# Patient Record
Sex: Female | Born: 1995 | Race: White | Hispanic: No | Marital: Single
Health system: Midwestern US, Community
[De-identification: ages and names within clinical notes are randomized; demographics above are authoritative.]

---
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
## 2019-08-04 ENCOUNTER — Other Ambulatory Visit: Payer: Self-pay

## 2019-08-04 ENCOUNTER — Emergency Department
Admission: EM | Admit: 2019-08-04 | Discharge: 2019-08-04 | Disposition: A | Discharge status: Home / Self Care | Payer: Self-pay | Attending: Emergency Medicine | Admitting: Emergency Medicine

## 2019-08-04 DIAGNOSIS — K047 Periapical abscess without sinus: Secondary | ICD-10-CM | POA: Insufficient documentation

## 2019-08-04 MED ORDER — IBUPROFEN 600 MG PO TABS: 600.0000 mg | ORAL_TABLET | Freq: Three times a day (TID) | ORAL | 0 refills | Status: AC | PRN

## 2019-08-04 MED ORDER — CLINDAMYCIN HCL 150 MG PO CAPS: ORAL_CAPSULE | ORAL | 0 refills | Status: AC

## 2019-08-04 NOTE — ED Triage Notes (Addendum)
Pt comes via POV from home with c/o mouth pain. Pt states this has been going on for awhile. Pt states last night her filling came out of her top front tooth and she noticed drainage.  Pt states pain. No obvious drainage or swelling visible

## 2019-08-04 NOTE — Discharge Instructions (Addendum)
Begin taking antibiotics as directed until completely finished.  It is important that you follow-up with a dentist.  A list of dental clinics is listed on your discharge papers.  Also they most likely will refer you to an oral surgeon as there is very little tooth material above the gumline.

## 2019-08-04 NOTE — ED Provider Notes (Signed)
[facility] Emergency Department Provider Note  ____________________________________________   First MD Initiated Contact with Patient 08/04/19 1128     (approximate)  I have reviewed the triage vital signs and the nursing notes.   HISTORY  Chief Complaint Dental Pain   HPI Maria Holland is a 23 y.o. female presents to the ED with complaint of mouth pain.  Patient states that she is aware that she has multiple dental cavities but has not followed up with an oral surgeon.  Patient noticed drainage coming from her tooth.  She is unaware of any fever or chills.  She denies any nausea or vomiting.  Patient does continue to smoke.     History reviewed. No pertinent past medical history.  There are no active problems to display for this patient.     Prior to Admission medications   Medication Sig Start Date End Date Taking? Authorizing Provider  clindamycin (CLEOCIN) 150 MG capsule 2 caps tid until finished 08/04/19   Bridget Hartshorn L, PA-C  ibuprofen (ADVIL) 600 MG tablet Take 1 tablet (600 mg total) by mouth every 8 (eight) hours as needed. 08/04/19   Tommi Rumps, PA-C    Allergies Patient has no allergy information on record.  No family history on file.  Social History Social History   Tobacco Use  . Smoking status: Not on file  Substance Use Topics  . Alcohol use: Not on file  . Drug use: Not on file    Review of Systems Constitutional: No fever/chills ENT: Positive dental pain. Cardiovascular: Denies chest pain.  Respiratory: Denies shortness of breath. Gastrointestinal:  No nausea, no vomiting.  Musculoskeletal: Negative for muscle aches. Skin: Negative for rash. Neurological: Negative for headaches, focal weakness or numbness. ___________________________________________   PHYSICAL EXAM:  VITAL SIGNS: ED Triage Vitals  Enc Vitals Group     BP      Pulse      Resp      Temp      Temp src      SpO2      Weight       Height      Head Circumference      Peak Flow      Pain Score      Pain Loc      Pain Edu?      Excl. in GC?    Constitutional: Alert and oriented. Well appearing and in no acute distress. Eyes: Conjunctivae are normal.  Head: Atraumatic. Mouth/Throat: Upper central bilateral and right incisor has open caries with little dental material above the gumline.  No active drainage at this time.  Area is tender to palpation. Neck: No stridor.   Hematological/Lymphatic/Immunilogical: No cervical lymphadenopathy. Cardiovascular: Normal rate, regular rhythm. Grossly normal heart sounds.  Good peripheral circulation. Respiratory: Normal respiratory effort.  No retractions. Lungs CTAB. Gastrointestinal: Soft and nontender. No distention. No abdominal bruits. No CVA tenderness. Musculoskeletal: Moves upper and lower extremities without any difficulty normal gait was noted. Neurologic:  Normal speech and language. No gross focal neurologic deficits are appreciated. No gait instability. Skin:  Skin is warm, dry and intact.  Psychiatric: Mood and affect are normal. Speech and behavior are normal.  ____________________________________________   LABS (all labs ordered are listed, but only abnormal results are displayed)  Labs Reviewed - No data to display   PROCEDURES  Procedure(s) performed (including Critical Care):  Procedures ____________________________________________   INITIAL IMPRESSION / ASSESSMENT AND PLAN / ED COURSE  As part of my medical decision making, I reviewed the following data within the electronic MEDICAL RECORD NUMBER Notes from prior ED visits and [place] Controlled Substance Database  Maria Holland was evaluated in Emergency Department on 08/04/2019 for the symptoms described in the history of present illness. She was evaluated in the context of the global COVID-19 pandemic, which necessitated consideration that the patient might be at risk for infection with the  SARS-CoV-2 virus that causes COVID-19. Institutional protocols and algorithms that pertain to the evaluation of patients at risk for COVID-19 are in a state of rapid change based on information released by regulatory bodies including the CDC and federal and state organizations. These policies and algorithms were followed during the patient's care in the ED.  23 year old female presents to the ED with complaint of dental pain.  Patient states that is been going on for several weeks.  She states that she has been treated 3 times in the past for her dental problems but still has not seen a dentist.  The last time she was on antibiotics was approximately 8 months ago.  She does not recall the antibiotic.  Patient continues to smoke.  Patient was placed on clindamycin tablets 3 times daily for the next 7 days.  She was given list of dental clinics in this area.  She is encouraged to take Tylenol or ibuprofen as needed for pain. ____________________________________________   FINAL CLINICAL IMPRESSION(S) / ED DIAGNOSES  Final diagnoses:  Dental abscess     ED Discharge Orders         Ordered    clindamycin (CLEOCIN) 150 MG capsule     08/04/19 1219    ibuprofen (ADVIL) 600 MG tablet  Every 8 hours PRN     08/04/19 1219           Note:  This document was prepared using Dragon voice recognition software and may include unintentional dictation errors.    Johnn Hai, PA-C 08/04/19 1233    Carrie Mew, MD 08/04/19 [phone]

## 2020-03-02 ENCOUNTER — Emergency Department: Payer: Medicaid Other

## 2020-03-02 ENCOUNTER — Other Ambulatory Visit: Payer: Self-pay

## 2020-03-02 ENCOUNTER — Emergency Department
Admission: EM | Admit: 2020-03-02 | Discharge: 2020-03-02 | Disposition: A | Discharge status: Home / Self Care | Payer: Medicaid Other | Attending: Emergency Medicine | Admitting: Emergency Medicine

## 2020-03-02 DIAGNOSIS — Z5321 Procedure and treatment not carried out due to patient leaving prior to being seen by health care provider: Secondary | ICD-10-CM | POA: Insufficient documentation

## 2020-03-02 DIAGNOSIS — Y929 Unspecified place or not applicable: Secondary | ICD-10-CM | POA: Insufficient documentation

## 2020-03-02 DIAGNOSIS — Y9321 Activity, ice skating: Secondary | ICD-10-CM | POA: Insufficient documentation

## 2020-03-02 DIAGNOSIS — S8992XA Unspecified injury of left lower leg, initial encounter: Secondary | ICD-10-CM | POA: Insufficient documentation

## 2020-03-02 DIAGNOSIS — Y999 Unspecified external cause status: Secondary | ICD-10-CM | POA: Insufficient documentation

## 2020-03-02 NOTE — ED Triage Notes (Signed)
Pt fell on her left knee while ice skating on Monday. Pt limping during ambulation. Pt has left knee wrapped up but states it is swollen and feels hard. Pt in NAD

## 2020-04-19 ENCOUNTER — Ambulatory Visit: Payer: Medicaid Other | Admitting: Obstetrics and Gynecology

## 2020-04-19 NOTE — Progress Notes (Deleted)
PCP:  Patient, No Pcp Per   No chief complaint on file.    HPI:      Maria Holland is a 24 y.o. No obstetric history on file. whose LMP was No LMP recorded. Patient has had an implant., presents today for her NP annual examination.  Her menses are {norm/abn:715}, lasting {number:22536} days.  Dysmenorrhea {dysmen:716}. She {does:18564} have intermenstrual bleeding.  Sex activity: {sex active:315163}.  Last Pap: {EXHB:716967893}  Results were: {norm/abn:16707::"no abnormalities"} /neg HPV DNA *** Hx of STDs: {STD hx:14358}  There is no FH of breast cancer. There is no FH of ovarian cancer. The patient {does:18564} do self-breast exams.  Tobacco use: {tob:20664} Alcohol use: {Alcohol:11675} No drug use.  Exercise: {exercise:31265}  She {does:18564} get adequate calcium and Vitamin D in her diet.    The pregnancy intention screening data noted above was reviewed. Potential methods of contraception were discussed. The patient elected to proceed with {Upstream End Methods:24109}.    No past medical history on file.  No past surgical history on file.  No family history on file.  Social History   Socioeconomic History  . Marital status: Single    Spouse name: Not on file  . Number of children: Not on file  . Years of education: Not on file  . Highest education level: Not on file  Occupational History  . Not on file  Tobacco Use  . Smoking status: Not on file  Substance and Sexual Activity  . Alcohol use: Not on file  . Drug use: Not on file  . Sexual activity: Not on file  Other Topics Concern  . Not on file  Social History Narrative  . Not on file   Social Determinants of Health   Financial Resource Strain:   . Difficulty of Paying Living Expenses:   Food Insecurity:   . Worried About Programme researcher, broadcasting/film/video in the Last Year:   . Barista in the Last Year:   Transportation Needs:   . Freight forwarder (Medical):   Marland Kitchen Lack of  Transportation (Non-Medical):   Physical Activity:   . Days of Exercise per Week:   . Minutes of Exercise per Session:   Stress:   . Feeling of Stress :   Social Connections:   . Frequency of Communication with Friends and Family:   . Frequency of Social Gatherings with Friends and Family:   . Attends Religious Services:   . Active Member of Clubs or Organizations:   . Attends Banker Meetings:   Marland Kitchen Marital Status:   Intimate Partner Violence:   . Fear of Current or Ex-Partner:   . Emotionally Abused:   Marland Kitchen Physically Abused:   . Sexually Abused:      Current Outpatient Medications:  .  clindamycin (CLEOCIN) 150 MG capsule, 2 caps tid until finished, Disp: 42 capsule, Rfl: 0 .  ibuprofen (ADVIL) 600 MG tablet, Take 1 tablet (600 mg total) by mouth every 8 (eight) hours as needed., Disp: 15 tablet, Rfl: 0     ROS:  Review of Systems BREAST: No symptoms   Objective: There were no vitals taken for this visit.   OBGyn Exam  Results: No results found for this or any previous visit (from the past 24 hour(s)).  Assessment/Plan: No diagnosis found.  No orders of the defined types were placed in this encounter.            GYN counsel {counseling:16159}  F/U  No follow-ups on file.  Baylyn Sickles B. Antoneo Ghrist, PA-C 04/19/2020 3:10 PM

## 2021-06-26 IMAGING — CR DG KNEE 1-2V PORT*L*
1 series · 4 of 4 positions shown · non-contrast
Comparison: None.

CLINICAL DATA: Fell while ice skating 4 days ago, left knee
swelling

EXAM:
PORTABLE LEFT KNEE - 1-2 VIEW

[Series 1: view not recorded · 0.14mm/px · 4 of 4 slices shown]
[im 1/4]
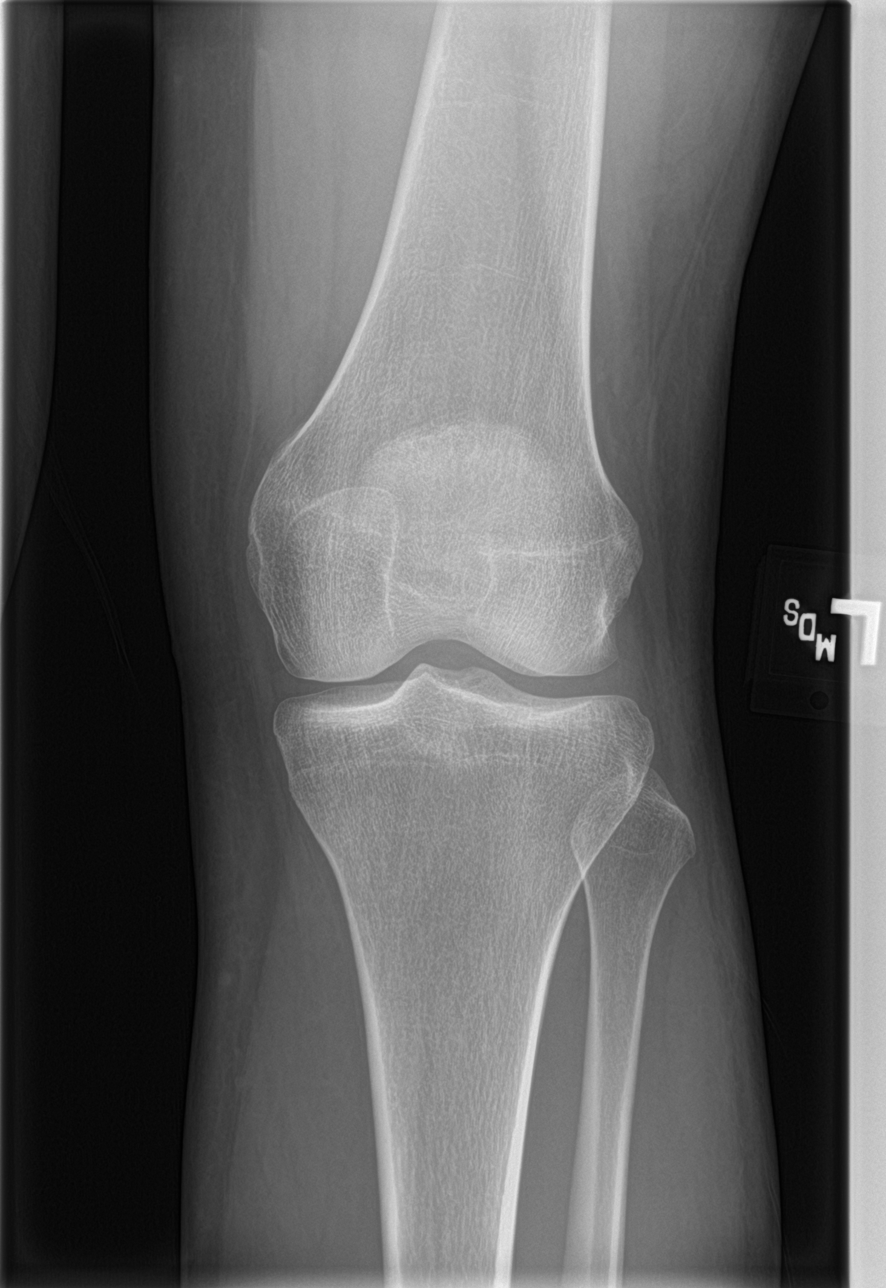
[im 2/4]
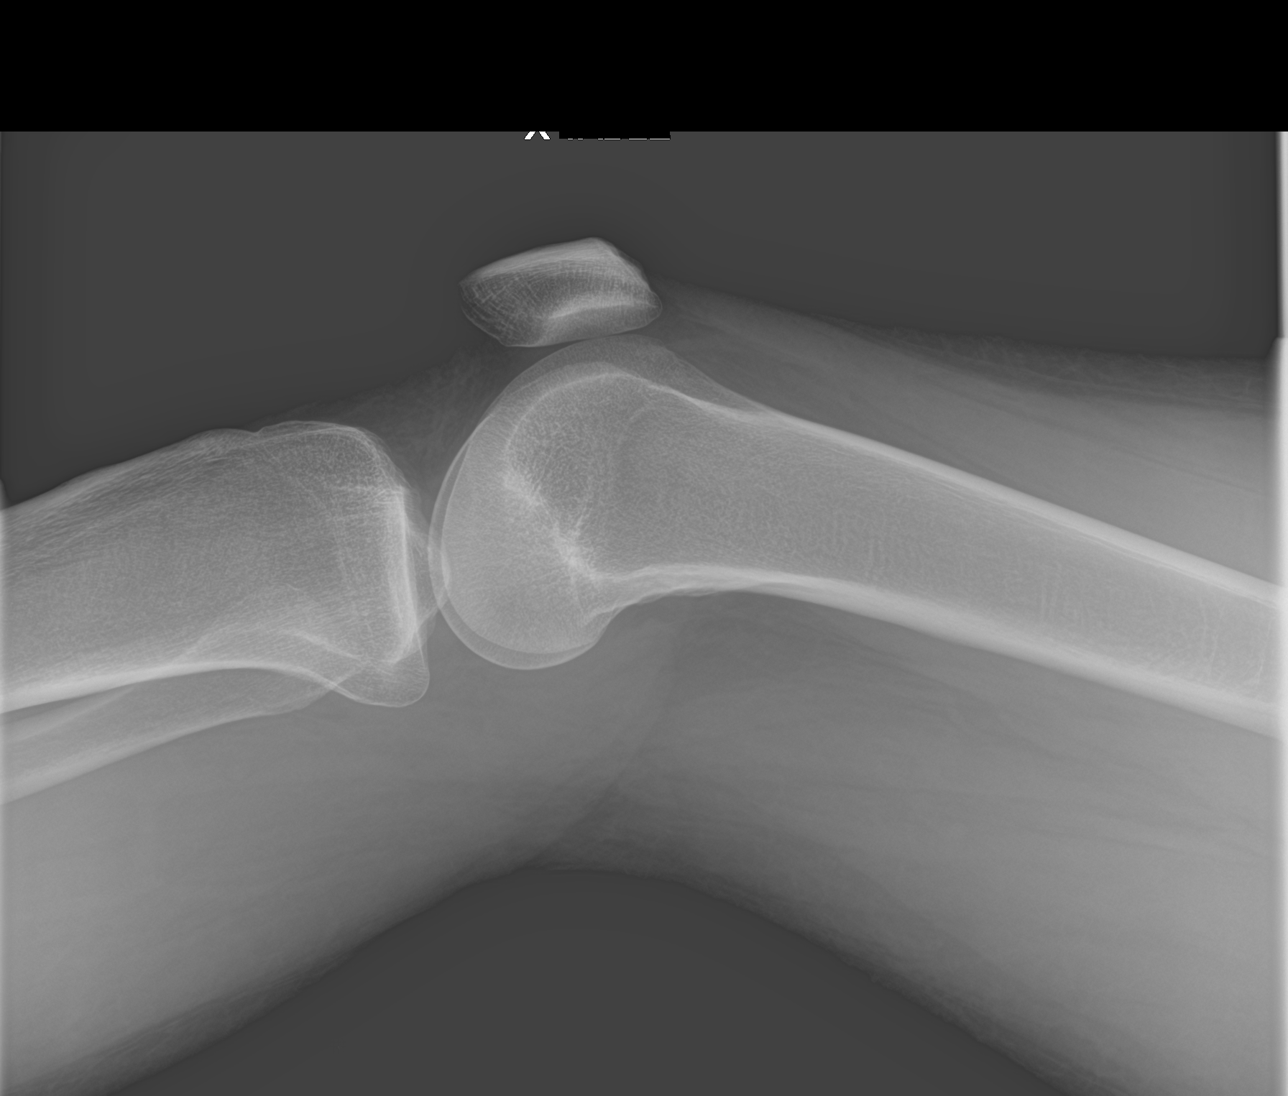
[im 3/4]
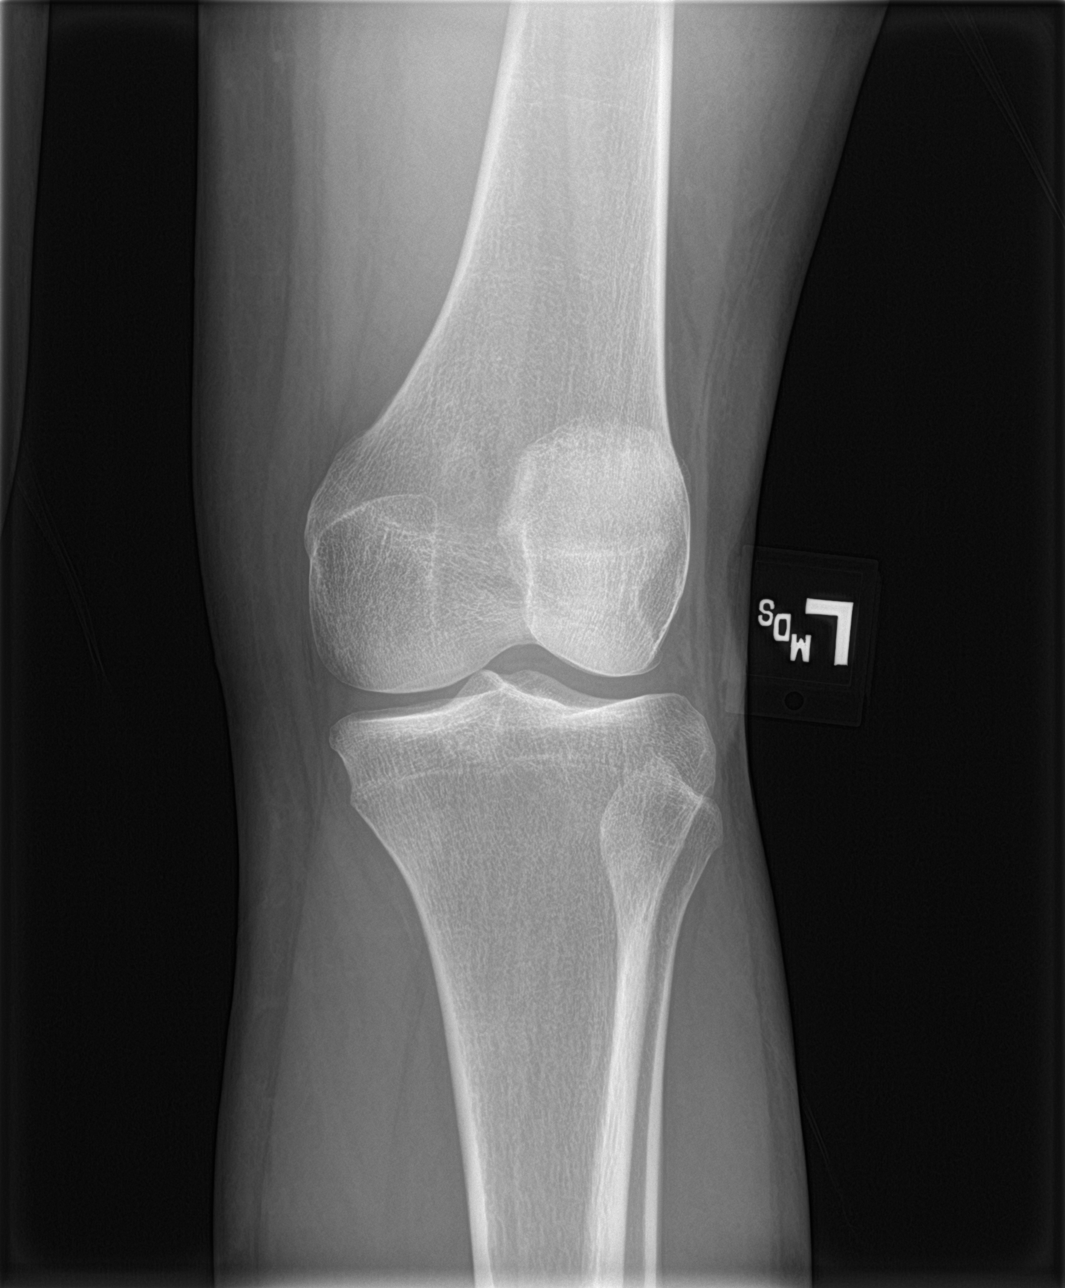
[im 4/4]
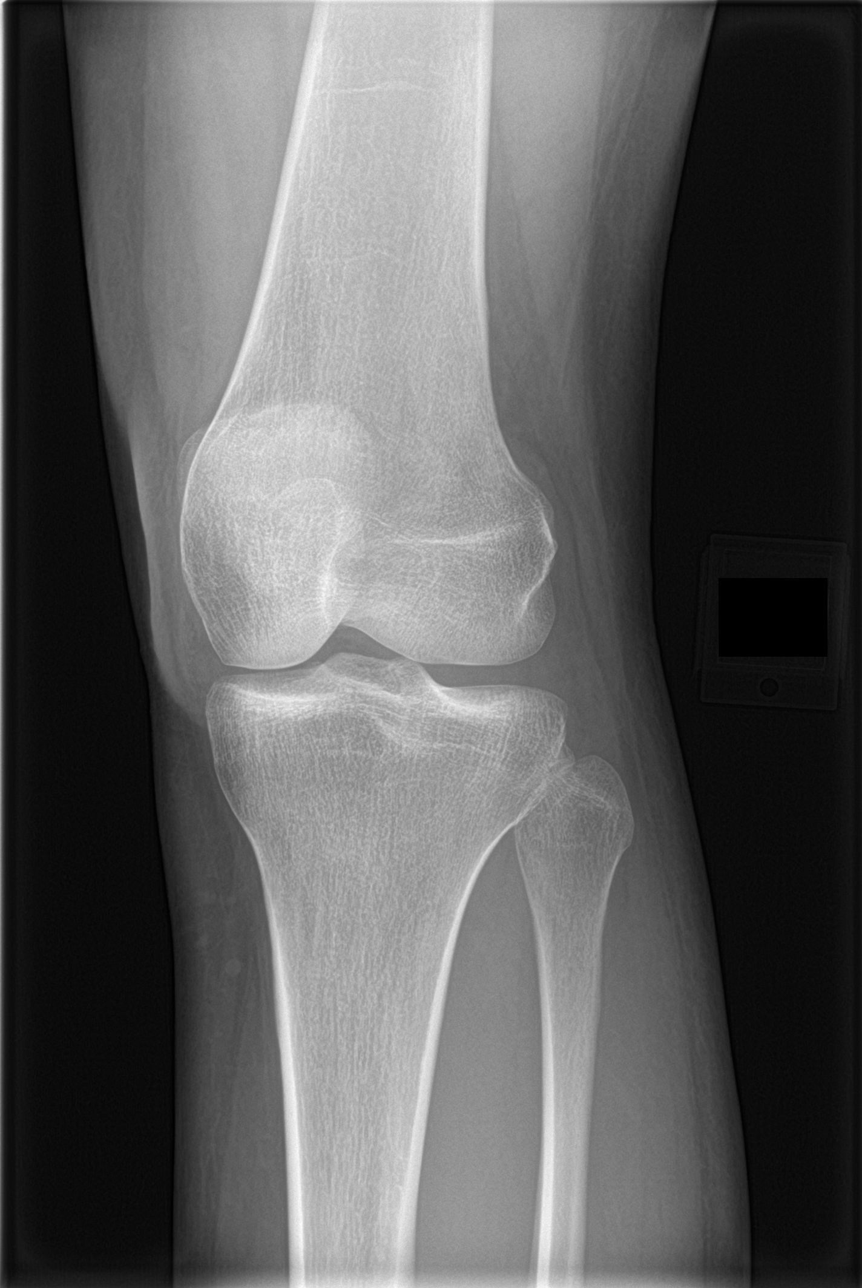

[4 of 4 positions shown; findings below may reference images not displayed]

FINDINGS: Frontal, bilateral oblique, lateral views of the left knee are
obtained. No fracture, subluxation, or dislocation. Joint spaces are
well preserved. No joint effusion.
IMPRESSION: 1. Unremarkable left knee.
